# Patient Record
Sex: Male | Born: 1955 | Race: White | Hispanic: No | Marital: Married | Smoking: Never smoker
Health system: Southern US, Community
[De-identification: ages and names within clinical notes are randomized; demographics above are authoritative.]

## PROBLEM LIST (undated history)

## (undated) DIAGNOSIS — E785 Hyperlipidemia, unspecified: Secondary | ICD-10-CM

## (undated) DIAGNOSIS — I219 Acute myocardial infarction, unspecified: Secondary | ICD-10-CM

## (undated) HISTORY — PX: KNEE SURGERY: SHX244

---

## 2010-12-17 ENCOUNTER — Encounter (INDEPENDENT_AMBULATORY_CARE_PROVIDER_SITE_OTHER): Payer: Self-pay | Admitting: *Deleted

## 2010-12-17 ENCOUNTER — Ambulatory Visit
Admission: RE | Admit: 2010-12-17 | Discharge: 2010-12-17 | Payer: Self-pay | Source: Home / Self Care | Admitting: Emergency Medicine

## 2010-12-17 DIAGNOSIS — E785 Hyperlipidemia, unspecified: Secondary | ICD-10-CM | POA: Insufficient documentation

## 2011-01-22 NOTE — Assessment & Plan Note (Signed)
Summary: FEVER,CHILLS,SOB,SINUS PROB./WSE   Vital Signs:  Patient Profile:   55 Years Old Male CC:      body aches, HA, sinus pressure x yesterday Height:     74 inches Weight:      236 pounds O2 Sat:      97 % O2 treatment:    Room Air Temp:     98.9 degrees F oral Pulse rate:   97 / minute Resp:     14 per minute BP sitting:   151 / 106  (left arm) Cuff size:   large  Vitals Entered By: Lajean Saver RN (December 17, 2010 4:29 PM)                  Updated Prior Medication List: * UNKNOWN CHOLESTEROL MED   Current Allergies: No known allergies History of Present Illness Chief Complaint: body aches, HA, sinus pressure x yesterday History of Present Illness: 55 Years Old Male complains of onset of cold symptoms for 3 days.  Cesar Garza has been using no OTC meds. + sore throat + cough No pleuritic pain No wheezing + nasal congestion + post-nasal drainage + sinus pain/pressure No chest congestion No itchy/red eyes No earache No hemoptysis No SOB No chills/sweats No fever No nausea No vomiting No abdominal pain No diarrhea No skin rashes + fatigue + myalgias + headache   REVIEW OF SYSTEMS Constitutional Symptoms       Complains of night sweats.     Denies fever, chills, weight loss, weight gain, and fatigue.  Eyes       Denies change in vision, eye pain, eye discharge, glasses, contact lenses, and eye surgery. Ear/Nose/Throat/Mouth       Complains of sinus problems.      Denies hearing loss/aids, change in hearing, ear pain, ear discharge, dizziness, frequent runny nose, frequent nose bleeds, sore throat, hoarseness, and tooth pain or bleeding.      Comments: sinus pressure Respiratory       Denies dry cough, productive cough, wheezing, shortness of breath, asthma, bronchitis, and emphysema/COPD.  Cardiovascular       Denies murmurs, chest pain, and tires easily with exhertion.    Gastrointestinal       Denies stomach pain, nausea/vomiting, diarrhea,  constipation, blood in bowel movements, and indigestion. Genitourniary       Denies painful urination, kidney stones, and loss of urinary control. Neurological       Complains of headaches.      Denies paralysis, seizures, and fainting/blackouts. Musculoskeletal       Complains of muscle pain, joint pain, and joint stiffness.      Denies decreased range of motion, redness, swelling, muscle weakness, and gout.      Comments: body aches Skin       Denies bruising, unusual mles/lumps or sores, and hair/skin or nail changes.  Psych       Denies mood changes, temper/anger issues, anxiety/stress, speech problems, depression, and sleep problems.  Past History:  Past Medical History: Hyperlipidemia  Past Surgical History: ACL bilateral knees  Family History: none  Social History: Occupation: Higher education careers adviser Married Never Smoked Alcohol use-no Drug use-no Smoking Status:  never Drug Use:  no Physical Exam General appearance: well developed, well nourished, no acute distress Head: mild maxillary and frontal sinus tenderness Ears: normal, no lesions or deformities Nasal: clear discharge Oral/Pharynx: tongue normal, posterior pharynx without erythema or exudate Chest/Lungs: no rales, wheezes, or rhonchi bilateral, breath sounds equal without effort Heart:  regular rate and  rhythm, no murmur Skin: no obvious rashes or lesions MSE: oriented to time, place, and person Assessment New Problems: UPPER RESPIRATORY INFECTION, ACUTE (ICD-465.9) HYPERLIPIDEMIA (ICD-272.4)   Patient Education: Patient and/or caregiver instructed in the following: rest, fluids.  Plan New Medications/Changes: AMOXICILLIN 875 MG TABS (AMOXICILLIN) 1 by mouth two times a day for 7 days  #14 x 0, 12/17/2010, Hoyt Koch MD PREDNISONE (PAK) 10 MG TABS (PREDNISONE) 6 day pack, use as directed  #1 x 0, 12/17/2010, Hoyt Koch MD  Planning Comments:   1)  Take the prescribed antibiotic as  instructed. (hold for a few days and only take if worsening since this is currently likely viral) 2)  Use nasal saline solution (over the counter) at least 3 times a day. 3)  Use over the counter decongestants like Zyrtec-D every 12 hours as needed to help with congestion. 4)  Can take tylenol every 6 hours or motrin every 8 hours for pain or fever. 5)  Follow up with your primary doctor  if no improvement in 5-7 days, sooner if increasing pain, fever, or new symptoms.    The patient and/or caregiver has been counseled thoroughly with regard to medications prescribed including dosage, schedule, interactions, rationale for use, and possible side effects and they verbalize understanding.  Diagnoses and expected course of recovery discussed and will return if not improved as expected or if the condition worsens. Patient and/or caregiver verbalized understanding.  Prescriptions: AMOXICILLIN 875 MG TABS (AMOXICILLIN) 1 by mouth two times a day for 7 days  #14 x 0   Entered and Authorized by:   Hoyt Koch MD   Signed by:   Hoyt Koch MD on 12/17/2010   Method used:   Print then Give to Patient   RxID:   [id number] PREDNISONE (PAK) 10 MG TABS (PREDNISONE) 6 day pack, use as directed  #1 x 0   Entered and Authorized by:   Hoyt Koch MD   Signed by:   Hoyt Koch MD on 12/17/2010   Method used:   Print then Give to Patient   RxID:   [id number]

## 2011-01-22 NOTE — Letter (Signed)
Summary: Out of Work  [facility]  [address]   [place], [place] [address]   Phone: [phone]  Fax: [phone]    December 17, 2010   Employee:  CHALMER ZHENG    To Whom It May Concern:   For Medical reasons, please excuse the above named employee from work for the following dates:  Start:   December 28th, 2011  End:   December 29th, 2011 Return December 30th, 2011  If you need additional information, please feel free to contact our office.         Sincerely,    Lajean Saver RN

## 2011-04-24 ENCOUNTER — Ambulatory Visit (INDEPENDENT_AMBULATORY_CARE_PROVIDER_SITE_OTHER): Payer: BC Managed Care – PPO | Admitting: Family Medicine

## 2011-04-24 ENCOUNTER — Encounter: Payer: Self-pay | Admitting: Family Medicine

## 2011-04-24 VITALS — BP 137/96 | HR 78 | Temp 98.3°F | Ht 74.0 in | Wt 232.0 lb

## 2011-04-24 DIAGNOSIS — J209 Acute bronchitis, unspecified: Secondary | ICD-10-CM

## 2011-04-24 MED ORDER — HYDROCODONE-HOMATROPINE 5-1.5 MG/5ML PO SYRP: ORAL_SOLUTION | ORAL | Status: DC

## 2011-04-24 MED ORDER — AZITHROMYCIN 250 MG PO TABS: ORAL_TABLET | ORAL | Status: AC

## 2011-04-24 NOTE — Progress Notes (Signed)
  Subjective:    Patient ID: Cesar Garza, male    DOB: Feb 11, 1956, 55 y.o.   MRN: [id number]  HPI54 yo WM presents for chest congestion that started 2 wks ago.  Has a sore throat.  No rhinorrhea.  Producing phlegm morning and night.  He is taking Mucinex which helps a little bit.  The cough is keeping him up at night.  No hemoptysis.  Quit smoking 20 yrs ago.  Feels SOB.  No fevers or chills.  He usually goes to the [place] for his care.  BP 137/96  Pulse 78  Temp(Src) 98.3 F (36.8 C) (Oral)  Ht 6\' 2"  (1.88 m)  Wt 232 lb (105.235 kg)  BMI 29.79 kg/m2  SpO2 96%      Review of Systems  Constitutional: Negative for fever, chills, appetite change and fatigue.  HENT: Positive for congestion, sore throat and postnasal drip. Negative for rhinorrhea.   Eyes: Negative for itching.  Respiratory: Positive for cough. Negative for chest tightness and wheezing.   Cardiovascular: Negative for chest pain, palpitations and leg swelling.  Gastrointestinal: Negative for nausea, vomiting, abdominal pain and diarrhea.  Psychiatric/Behavioral: Positive for sleep disturbance. Negative for dysphoric mood. The patient is not nervous/anxious.        Objective:   Physical Exam  Constitutional: He appears well-developed and well-nourished.  HENT:  Head: Normocephalic.  Right Ear: External ear normal.  Left Ear: External ear normal.  Mouth/Throat: Oropharynx is clear and moist.       Nasal congestion, sinuses NTTP  Eyes: Conjunctivae are normal.  Cardiovascular: Normal rate and normal heart sounds.   No murmur heard. Pulmonary/Chest: Effort normal and breath sounds normal. No respiratory distress.       Dry hacking rhonchorous cough  Musculoskeletal: He exhibits no edema.  Lymphadenopathy:    He has cervical adenopathy.  Skin: Skin is warm and dry. No rash noted.  Psychiatric: He has a normal mood and affect.          Assessment & Plan:

## 2011-04-24 NOTE — Patient Instructions (Signed)
Take Zithromax x 5 days for bronchitis.  Use Delsym during the day for cough (OTC) and Hycodan at night. Rest, clear fluids and call if not improved in 1 wk.  I suggest coming back for a nurse BP check in the next month.

## 2011-04-24 NOTE — Assessment & Plan Note (Signed)
Given prolonged 2 + wk of hacking cough with rhonchi, will treat with Zithromax x 5 days.  He can take delsym during the day for cough and hycodan at night.  Call if not improved in 1 wk.

## 2011-12-02 ENCOUNTER — Emergency Department (INDEPENDENT_AMBULATORY_CARE_PROVIDER_SITE_OTHER)
Admission: EM | Admit: 2011-12-02 | Discharge: 2011-12-02 | Disposition: A | Discharge status: Home / Self Care | Payer: BC Managed Care – PPO | Source: Home / Self Care | Attending: Family Medicine | Admitting: Family Medicine

## 2011-12-02 ENCOUNTER — Encounter: Payer: Self-pay | Admitting: *Deleted

## 2011-12-02 DIAGNOSIS — R6889 Other general symptoms and signs: Secondary | ICD-10-CM

## 2011-12-02 HISTORY — DX: Hyperlipidemia, unspecified: E78.5

## 2011-12-02 MED ORDER — BENZONATATE 200 MG PO CAPS: 200.0000 mg | ORAL_CAPSULE | Freq: Every day | ORAL | Status: AC

## 2011-12-02 MED ORDER — OSELTAMIVIR PHOSPHATE 75 MG PO CAPS: 75.0000 mg | ORAL_CAPSULE | Freq: Two times a day (BID) | ORAL | Status: AC

## 2011-12-02 NOTE — ED Provider Notes (Signed)
History     CSN: [id number] Arrival date & time: 12/02/2011  8:14 AM   First MD Initiated Contact with Patient 12/02/11 [phone]      Chief Complaint  Patient presents with  . Generalized Body Aches      HPI Comments: HPI : Flu symptoms started last night. Fever with chills, sweats, myalgias, fatigue, headache. Symptoms are progressively worsening, despite trying OTC fever reducing medicine and rest and fluids. Has decreased appetite, but tolerating some liquids by mouth.   Review of Systems: Positive for fatigue, mild nasal congestion, mild sore throat, mild swollen anterior neck glands, mild cough. Negative for acute vision changes, stiff neck, focal weakness, syncope, seizures, respiratory distress, vomiting, diarrhea, GU symptoms.    Past Medical History  Diagnosis Date  . Hyperlipidemia     Past Surgical History  Procedure Date  . Knee surgery     both knees x 5     Family History  Problem Relation Age of Onset  . Diabetes Mother     History  Substance Use Topics  . Smoking status: Never Smoker   . Smokeless tobacco: Not on file  . Alcohol Use: No      Review of Systems  Allergies  Review of patient's allergies indicates no known allergies.  Home Medications   Current Outpatient Rx  Name Route Sig Dispense Refill  . BENZONATATE 200 MG PO CAPS Oral Take 1 capsule (200 mg total) by mouth at bedtime. Take as needed for cough 12 capsule 0  . HYDROCODONE-HOMATROPINE 5-1.5 MG/5ML PO SYRP  5 ml po qhs prn cough 120 mL 0  . OSELTAMIVIR PHOSPHATE 75 MG PO CAPS Oral Take 1 capsule (75 mg total) by mouth every 12 (twelve) hours. 10 capsule 0    BP 130/80  Pulse 106  Temp(Src) 98.5 F (36.9 C) (Oral)  Resp 16  Ht 6\' 2"  (1.88 m)  Wt 238 lb (107.956 kg)  BMI 30.56 kg/m2  SpO2 95%  Physical Exam Nursing notes and Vital Signs reviewed. Appearance:  Patient appears healthy, stated age, and in no acute distress Eyes:  Pupils are equal, round, and reactive to  light and accomodation.  Extraocular movement is intact.  Conjunctivae are not inflamed  Ears:  Canals normal.  Tympanic membranes normal.  Nose:  Mildly congested turbinates.  No sinus tenderness.    Pharynx:  Erythematous and slightly swollen without obstruction.  Neck:  Supple.  Slightly tender shotty posterior nodes are palpated bilaterally  Lungs:  Clear to auscultation.  Breath sounds are equal.  Heart:  Regular rate and rhythm without murmurs, rubs, or gallops.  Abdomen:  Nontender without masses or hepatosplenomegaly.  Bowel sounds are present.  No CVA or flank tenderness.  Extremities:  No edema.  No calf tenderness Skin:  No rash present.   ED Course  Procedures       1. Influenza-like illness        MDM There is no evidence of bacterial infection today.   Begin Tamiflu.  Cough suppressant at bedtime. Take Mucinex, or Mucinex D (guaifenesin with decongestant) twice daily for congestion.  Increase fluid intake, rest. May use Afrin nasal spray (or generic oxymetazoline) twice daily for about 5 days.  Also recommend using saline nasal spray several times daily and/or saline nasal irrigation. Stop all antihistamines for now, and other non-prescription cough/cold preparations. May take Ibuprofen 200mg , 4 tabs every 8 hours with food for headache, body aches, etc. Recommend flu shot when well. Follow-up with family  doctor if not improving 5 days.        Donna Christen, MD 12/02/11 [phone]

## 2011-12-02 NOTE — ED Notes (Signed)
Patient c/o body aches, chills/sweats, HA and nausea x last night. He did not receive a flu shot. Taken Advil @ 4am.

## 2011-12-04 ENCOUNTER — Telehealth: Payer: Self-pay | Admitting: Emergency Medicine

## 2011-12-19 ENCOUNTER — Emergency Department
Admission: EM | Admit: 2011-12-19 | Discharge: 2011-12-19 | Disposition: A | Discharge status: Home / Self Care | Payer: BC Managed Care – PPO | Source: Home / Self Care

## 2014-08-22 ENCOUNTER — Emergency Department (HOSPITAL_COMMUNITY)
Admission: EM | Admit: 2014-08-22 | Discharge: 2014-08-22 | Discharge status: Against Medical Advice | Payer: Federal, State, Local not specified - PPO | Attending: Emergency Medicine | Admitting: Emergency Medicine

## 2014-08-22 ENCOUNTER — Encounter (HOSPITAL_COMMUNITY): Payer: Self-pay | Admitting: Emergency Medicine

## 2014-08-22 DIAGNOSIS — N200 Calculus of kidney: Secondary | ICD-10-CM | POA: Diagnosis not present

## 2014-08-22 DIAGNOSIS — R109 Unspecified abdominal pain: Secondary | ICD-10-CM | POA: Diagnosis present

## 2014-08-22 MED ORDER — SODIUM CHLORIDE 0.9 % IV BOLUS (SEPSIS): 1000.0000 mL | Freq: Once | INTRAVENOUS | Status: DC

## 2014-08-22 NOTE — ED Provider Notes (Signed)
Patient left prior to evaluation   Richardean Canal, MD 08/22/14 [phone]

## 2014-08-22 NOTE — ED Notes (Signed)
Patient in from work via EMS. Patient states he was urinating in the bathroom and began to have sharpe pain left abdomin and flank. Patient does have hx of kidney stone. Patient was given of Fentanyl with EMS. Patient was having some nausea but is resolved.

## 2017-10-22 ENCOUNTER — Other Ambulatory Visit: Payer: Self-pay | Admitting: Internal Medicine

## 2017-10-22 DIAGNOSIS — M25562 Pain in left knee: Secondary | ICD-10-CM

## 2017-10-22 DIAGNOSIS — M25561 Pain in right knee: Secondary | ICD-10-CM

## 2017-11-03 ENCOUNTER — Ambulatory Visit
Admission: RE | Admit: 2017-11-03 | Discharge: 2017-11-03 | Disposition: A | Discharge status: Home / Self Care | Payer: Self-pay | Source: Ambulatory Visit | Attending: Internal Medicine | Admitting: Internal Medicine

## 2017-11-03 ENCOUNTER — Other Ambulatory Visit: Payer: Federal, State, Local not specified - PPO

## 2017-11-03 DIAGNOSIS — M25562 Pain in left knee: Secondary | ICD-10-CM

## 2017-11-03 DIAGNOSIS — M25561 Pain in right knee: Secondary | ICD-10-CM

## 2019-01-06 ENCOUNTER — Other Ambulatory Visit: Payer: Self-pay

## 2019-01-06 ENCOUNTER — Emergency Department
Admission: EM | Admit: 2019-01-06 | Discharge: 2019-01-06 | Disposition: A | Discharge status: Home / Self Care | Payer: Federal, State, Local not specified - PPO | Source: Home / Self Care

## 2019-01-06 DIAGNOSIS — I440 Atrioventricular block, first degree: Secondary | ICD-10-CM

## 2019-01-06 DIAGNOSIS — R079 Chest pain, unspecified: Secondary | ICD-10-CM | POA: Diagnosis not present

## 2019-01-06 DIAGNOSIS — R9431 Abnormal electrocardiogram [ECG] [EKG]: Secondary | ICD-10-CM

## 2019-01-06 MED ORDER — NITROGLYCERIN 0.4 MG SL SUBL
0.4000 mg | SUBLINGUAL_TABLET | SUBLINGUAL | Status: DC | PRN
  Administered 2019-01-06: 0.4 mg via SUBLINGUAL

## 2019-01-06 MED ORDER — ASPIRIN 81 MG PO CHEW
324.0000 mg | CHEWABLE_TABLET | Freq: Once | ORAL | Status: AC
  Administered 2019-01-06: 324 mg via ORAL

## 2019-01-06 MED ORDER — ONDANSETRON 4 MG PO TBDP
4.0000 mg | ORAL_TABLET | Freq: Once | ORAL | Status: AC
  Administered 2019-01-06: 4 mg via ORAL

## 2019-01-06 NOTE — ED Triage Notes (Signed)
Pt stated that he has had heartburn since this morning.  Has taken mylanta, and a GI cocktail.  Feels like there is a knot in his chest.

## 2019-01-06 NOTE — ED Provider Notes (Signed)
[facility]    CSN: [id number] Arrival date & time: 01/06/19  1843     History   Chief Complaint No chief complaint on file.   HPI Cesar Garza is a 63 y.o. male.   HPI  Patient presents today with one day of chest pain with nausea. This is a new problem and has been occurring all day. He initially thought symptoms were related to indigestion and had taken Mylanta without symptoms improvement. Blood pressure elevated on arrival 165/89. He has positive risk factors for ACS including untreated hyperlipidemia, obesity, positive family history of heart disease mother and brother recently passed away of heart attack. No prior history of cardiovascular disease.  EKG significant for 1st degree block. He has not taken any aspirin prior to arrival.  Past Medical History:  Diagnosis Date  . Hyperlipidemia     Patient Active Problem List   Diagnosis Date Noted  . Bronchitis, acute 04/24/2011  . HYPERLIPIDEMIA 12/17/2010    Past Surgical History:  Procedure Laterality Date  . KNEE SURGERY     both knees x 5        Home Medications    Prior to Admission medications   Medication Sig Start Date End Date Taking? Authorizing Provider  ibuprofen (ADVIL,MOTRIN) 800 MG tablet Take 800 mg by mouth daily.    [provider]  Multiple Vitamin (MULTIVITAMIN WITH MINERALS) TABS tablet Take 1 tablet by mouth daily.    [provider]    Family History Family History  Problem Relation Age of Onset  . Diabetes Mother     Social History Social History   Tobacco Use  . Smoking status: Never Smoker  Substance Use Topics  . Alcohol use: No  . Drug use: No     Allergies   Patient has no known allergies.   Review of Systems Review of Systems  Pertinent negatives listed in HPI Physical Exam Triage Vital Signs ED Triage Vitals  Enc Vitals Group     BP      Pulse      Resp      Temp      Temp src      SpO2      Weight      Height      Head  Circumference      Peak Flow      Pain Score      Pain Loc      Pain Edu?      Excl. in GC?    No data found.  Updated Vital Signs BP (!) 146/82   Pulse 70   Resp 18   Ht 6\' 2"  (1.88 m)   Wt 225 lb (102.1 kg)   SpO2 98%   BMI 28.89 kg/m   Visual Acuity Right Eye Distance:   Left Eye Distance:   Bilateral Distance:    Right Eye Near:   Left Eye Near:    Bilateral Near:     Physical Exam Physical Exam: Constitutional: Patient appears well-developed and well-nourished. No distress. HENT: Normocephalic, atraumatic,  Eyes: Conjunctivae and EOM are normal. PERRLA, no scleral icterus. Neck: Normal ROM. Neck supple. No JVD. No tracheal deviation. No thyromegaly. CVS: RRR, S1/S2 +, no murmurs, no gallops, no carotid bruit.  Pulmonary: Effort and breath sounds normal, no stridor, rhonchi, wheezes, rales.  Abdominal: Soft. BS +, no distension, tenderness, rebound or guarding.  Musculoskeletal: Normal range of motion. No edema and no tenderness.  Neuro: Alert, oriented x  3. Skin: Skin is warm , bilateral hand color is dusky, grayish. Psychiatric: Normal mood and affect. Behavior, judgment, thought content normal. UC Treatments / Results  Labs (all labs ordered are listed, but only abnormal results are displayed) Labs Reviewed - No data to display  EKG None  Radiology No results found.  Procedures Procedures (including critical care time)  Medications Ordered in UC Medications - No data to display  Initial Impression / Assessment and Plan / UC Course  I have reviewed the triage vital signs and the nursing notes.  Pertinent labs & imaging results that were available during my care of the patient were reviewed by me and considered in my medical decision making (see chart for details).   Patient transported via EMS to [facility] for work-up for possible ACS.  Reports given to EMS, copy of EKG, and demographics provided. Spouse accompanied patient with  EMS.   Final Clinical Impressions(s) / UC Diagnoses   Final diagnoses:  Chest pain, unspecified type  Abnormal EKG  Heart block AV first degree   Discharge Instructions   None    ED Prescriptions    None     Controlled Substance Prescriptions [place] Controlled Substance Registry consulted? Not Applicable   Bing Neighbors, FNP 01/08/19 1123

## 2019-01-07 ENCOUNTER — Telehealth: Payer: Self-pay | Admitting: Emergency Medicine

## 2019-01-07 MED ORDER — METOCLOPRAMIDE HCL 5 MG/ML IJ SOLN
10.00 | INTRAMUSCULAR | Status: DC
Start: ? — End: 2019-01-07

## 2019-01-07 MED ORDER — ALUM & MAG HYDROXIDE-SIMETH 200-200-20 MG/5ML PO SUSP
30.00 | ORAL | Status: DC
Start: ? — End: 2019-01-07

## 2019-01-07 MED ORDER — GENERIC EXTERNAL MEDICATION
10.00 | Status: DC
Start: ? — End: 2019-01-07

## 2019-01-07 MED ORDER — ASPIRIN EC 81 MG PO TBEC
81.00 | DELAYED_RELEASE_TABLET | ORAL | Status: DC
Start: 2019-01-08 — End: 2019-01-07

## 2019-01-07 MED ORDER — NITROGLYCERIN IN D5W 200-5 MCG/ML-% IV SOLN
10.00 | INTRAVENOUS | Status: DC
Start: ? — End: 2019-01-07

## 2019-01-07 MED ORDER — MUPIROCIN 2 % EX OINT
TOPICAL_OINTMENT | CUTANEOUS | Status: DC
Start: 2019-01-10 — End: 2019-01-07

## 2019-01-07 MED ORDER — GENERIC EXTERNAL MEDICATION
650.00 | Status: DC
Start: ? — End: 2019-01-07

## 2019-01-07 MED ORDER — GENERIC EXTERNAL MEDICATION
5.00 | Status: DC
Start: ? — End: 2019-01-07

## 2019-01-07 MED ORDER — GENERIC EXTERNAL MEDICATION
30.00 | Status: DC
Start: ? — End: 2019-01-07

## 2019-01-07 MED ORDER — SENNA-DOCUSATE SODIUM 8.6-50 MG PO TABS
1.00 | ORAL_TABLET | ORAL | Status: DC
Start: ? — End: 2019-01-07

## 2019-01-07 MED ORDER — METOPROLOL TARTRATE 25 MG PO TABS
25.00 | ORAL_TABLET | ORAL | Status: DC
Start: 2019-01-10 — End: 2019-01-07

## 2019-01-07 MED ORDER — PANTOPRAZOLE SODIUM 40 MG PO TBEC
40.00 | DELAYED_RELEASE_TABLET | ORAL | Status: DC
Start: 2019-01-11 — End: 2019-01-07

## 2019-01-07 MED ORDER — ALPRAZOLAM 0.25 MG PO TABS
0.25 | ORAL_TABLET | ORAL | Status: DC
Start: ? — End: 2019-01-07

## 2019-01-07 MED ORDER — MORPHINE SULFATE (PF) 2 MG/ML IV SOLN
2.00 | INTRAVENOUS | Status: DC
Start: ? — End: 2019-01-07

## 2019-01-07 MED ORDER — SUCRALFATE 1 GM/10ML PO SUSP
1.00 | ORAL | Status: DC
Start: 2019-01-07 — End: 2019-01-07

## 2019-01-07 MED ORDER — MAGNESIUM HYDROXIDE 400 MG/5ML PO SUSP
30.00 | ORAL | Status: DC
Start: ? — End: 2019-01-07

## 2019-01-07 MED ORDER — ATORVASTATIN CALCIUM 80 MG PO TABS
80.00 | ORAL_TABLET | ORAL | Status: DC
Start: 2019-01-07 — End: 2019-01-07

## 2019-01-07 MED ORDER — GENERIC EXTERNAL MEDICATION
1.00 | Status: DC
Start: ? — End: 2019-01-07

## 2019-01-07 MED ORDER — NITROGLYCERIN 0.4 MG SL SUBL
0.40 | SUBLINGUAL_TABLET | SUBLINGUAL | Status: DC
Start: ? — End: 2019-01-07

## 2019-01-07 MED ORDER — DIPHENHYDRAMINE HCL 25 MG PO CAPS
25.00 | ORAL_CAPSULE | ORAL | Status: DC
Start: ? — End: 2019-01-07

## 2019-01-07 MED ORDER — SODIUM CHLORIDE 0.9 % IV SOLN
10.00 | INTRAVENOUS | Status: DC
Start: ? — End: 2019-01-07

## 2019-01-07 NOTE — Telephone Encounter (Signed)
Spoke with patient's wife; patient did get diagnosed with MI at [facility] ER and was then transferred to [facility]. He is stable and they are appreciative.

## 2019-01-12 MED ORDER — ASPIRIN EC 81 MG PO TBEC
81.00 | DELAYED_RELEASE_TABLET | ORAL | Status: DC
Start: 2019-01-11 — End: 2019-01-12

## 2019-01-12 MED ORDER — LEVOFLOXACIN 750 MG PO TABS
750.00 | ORAL_TABLET | ORAL | Status: DC
Start: 2019-01-11 — End: 2019-01-12

## 2019-01-12 MED ORDER — ROSUVASTATIN CALCIUM 40 MG PO TABS
40.00 | ORAL_TABLET | ORAL | Status: DC
Start: 2019-01-10 — End: 2019-01-12

## 2019-01-12 MED ORDER — METOCLOPRAMIDE HCL 5 MG/ML IJ SOLN
10.00 | INTRAMUSCULAR | Status: DC
Start: ? — End: 2019-01-12

## 2019-01-12 MED ORDER — ALPRAZOLAM 0.25 MG PO TABS
0.25 | ORAL_TABLET | ORAL | Status: DC
Start: ? — End: 2019-01-12

## 2019-01-12 MED ORDER — SENNA-DOCUSATE SODIUM 8.6-50 MG PO TABS
1.00 | ORAL_TABLET | ORAL | Status: DC
Start: ? — End: 2019-01-12

## 2019-01-12 MED ORDER — MORPHINE SULFATE (PF) 4 MG/ML IV SOLN
2.00 | INTRAVENOUS | Status: DC
Start: ? — End: 2019-01-12

## 2019-01-12 MED ORDER — NITROGLYCERIN 0.4 MG SL SUBL
0.40 | SUBLINGUAL_TABLET | SUBLINGUAL | Status: DC
Start: ? — End: 2019-01-12

## 2019-01-12 MED ORDER — BENZOCAINE-MENTHOL 15-3.6 MG MT LOZG
1.00 | LOZENGE | OROMUCOSAL | Status: DC
Start: ? — End: 2019-01-12

## 2019-01-12 MED ORDER — TICAGRELOR 90 MG PO TABS
90.00 | ORAL_TABLET | ORAL | Status: DC
Start: 2019-01-10 — End: 2019-01-12

## 2019-01-28 IMAGING — MR MR KNEE*R* W/O CM
3 series · 18 of 40 positions shown · non-contrast
Comparison: None.

CLINICAL DATA: Right knee pain.  Akjagul research study.

EXAM:
MRI OF THE RIGHT KNEE WITHOUT CONTRAST
TECHNIQUE: Multiplanar, multisequence MR imaging of the knee was performed. No
intravenous contrast was administered.

[Series 3: T1 · coronal · 3.0mm · 0.33mm/px · 3 of 30 slices shown]
[im 5/30]
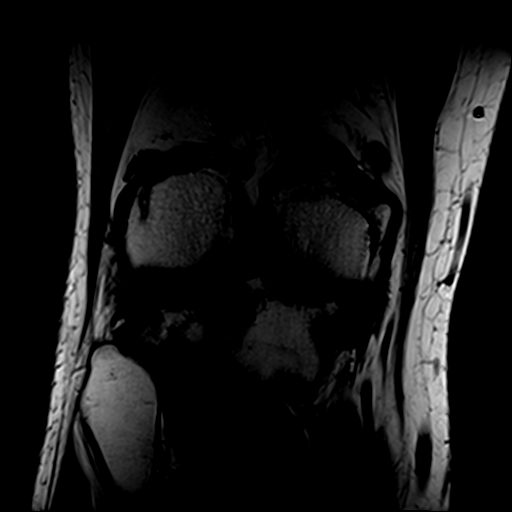
[im 16/30]
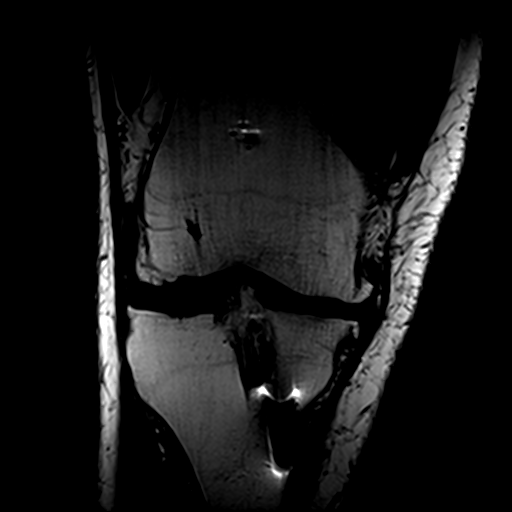
[im 25/30]
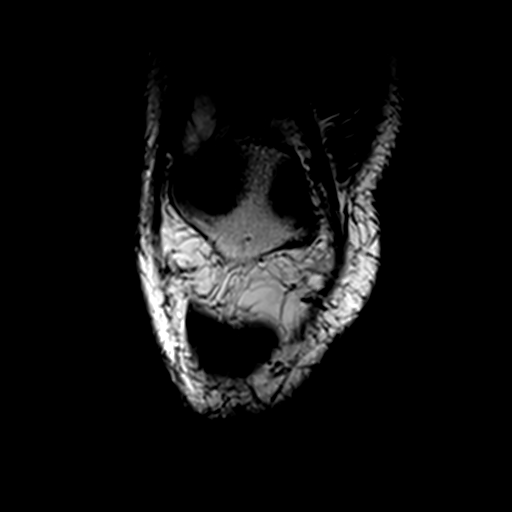

[Series 4: PD fat-sat · coronal · 3.0mm · 0.33mm/px · 9 of 30 slices shown (1 of 2)]
[im 1/30]
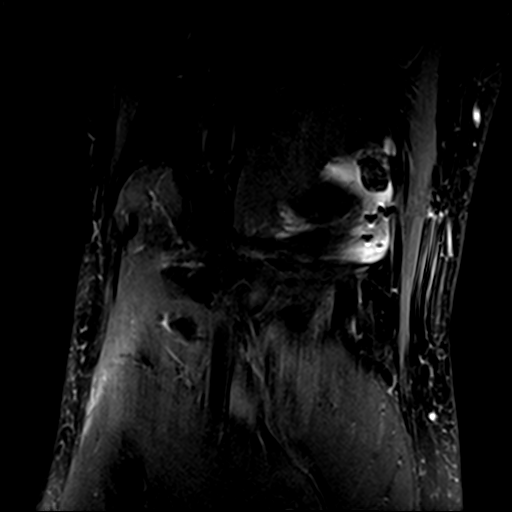
[im 5/30]
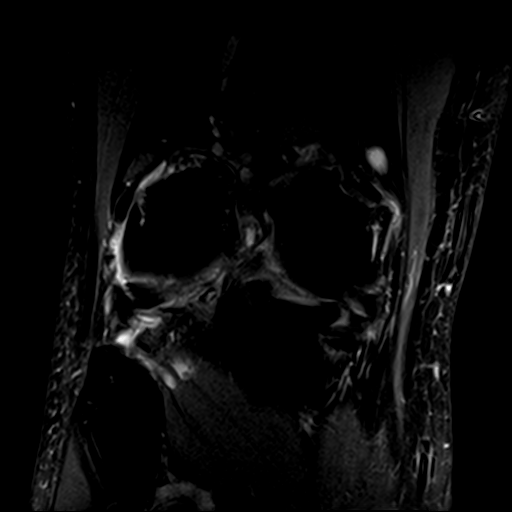
[im 10/30]
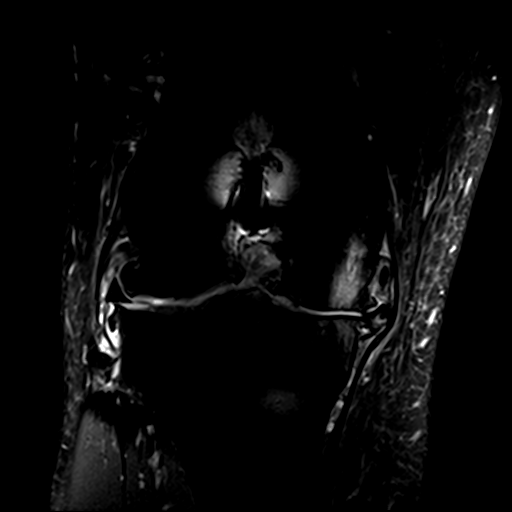
[im 13/30]
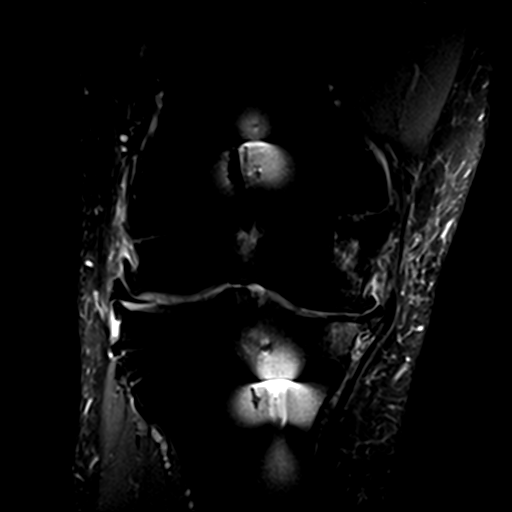
[im 15/30]
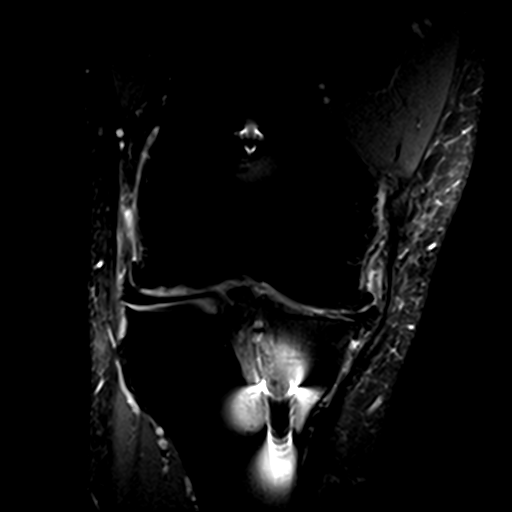
[im 17/30]
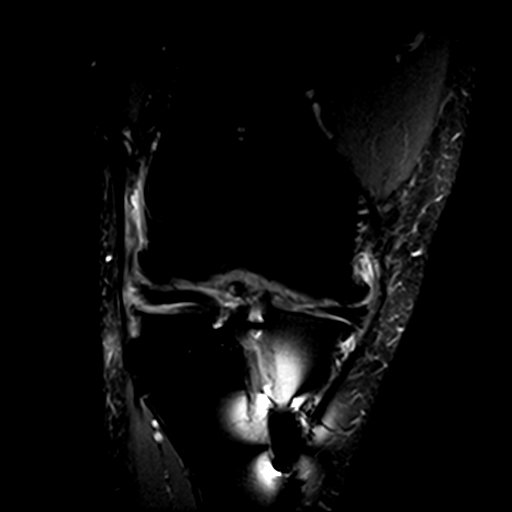
[im 20/30]
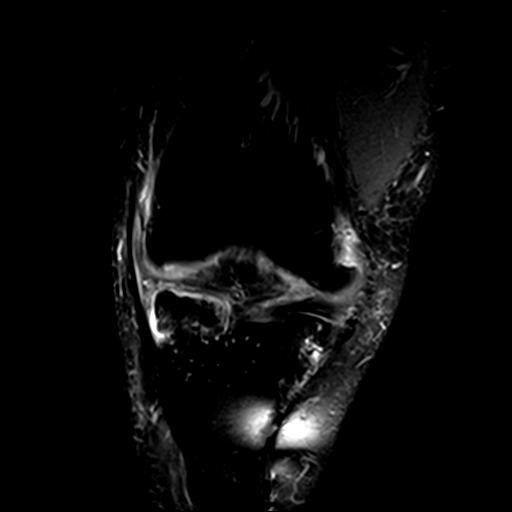
[im 25/30]
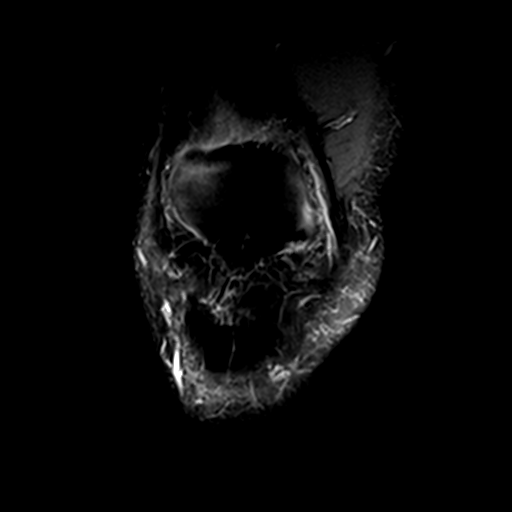
[im 30/30]
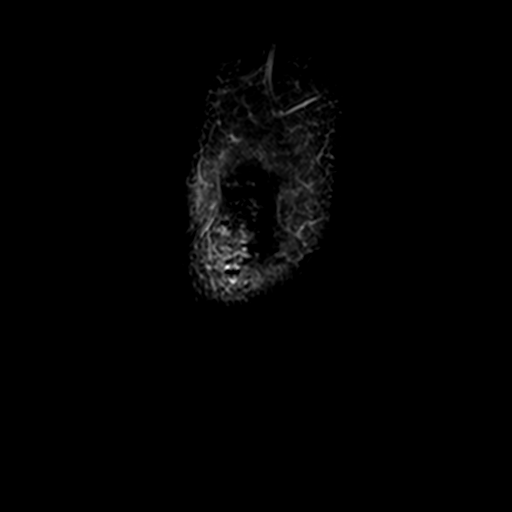

[Series 5: PD fat-sat · sagittal · 3.0mm · 0.33mm/px · 6 of 30 slices shown (2 of 2)]
[im 1/30]
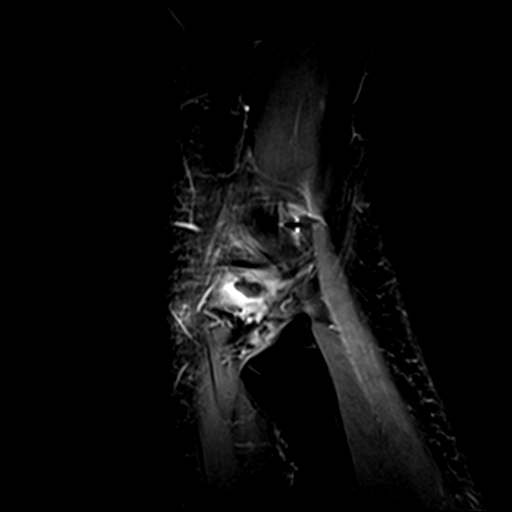
[im 5/30]
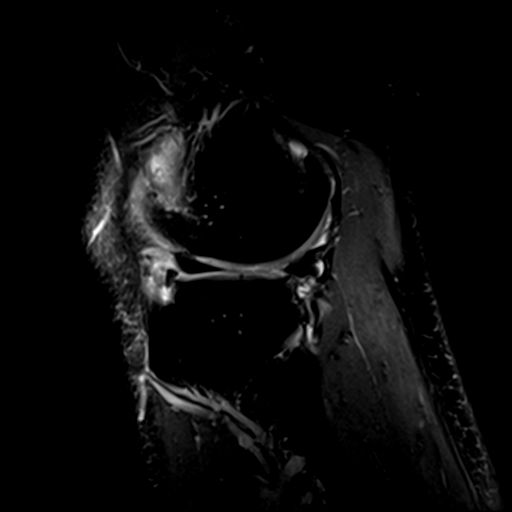
[im 10/30]
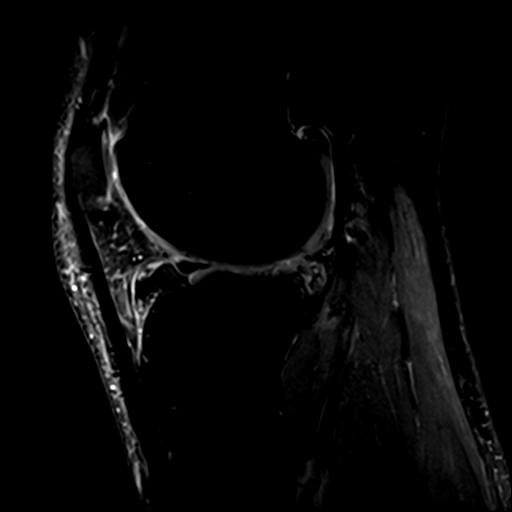
[im 13/30]
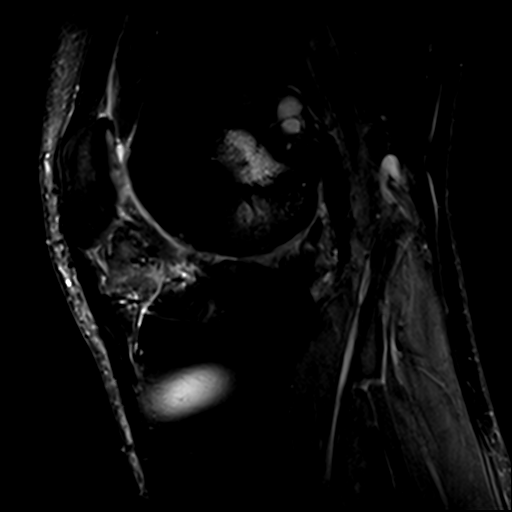
[im 15/30]
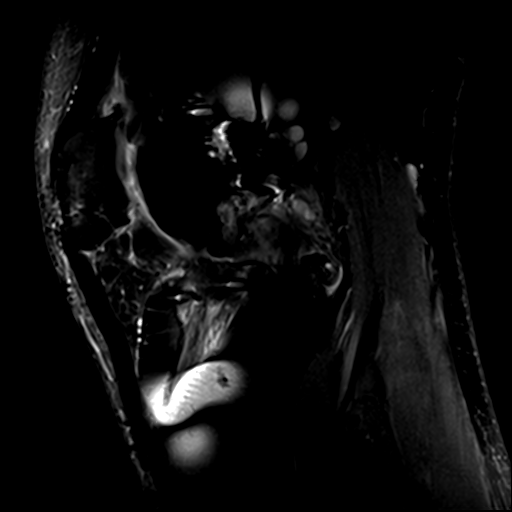
[im 25/30]
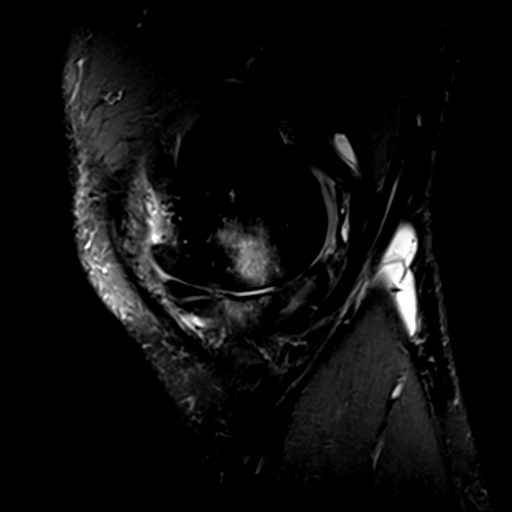

[18 of 40 positions shown; findings below may reference images not displayed]

FINDINGS: MENISCI

Medial meniscus: Complete avulsion of the posterior root with
extrusion and horizontal tearing of the body.

Lateral meniscus: Complex maceration of the posterior horn and root.
Oblique undersurface tear of the body.

LIGAMENTS

Cruciates: Prior anterior cruciate ligament reconstruction with
nonvisualization of the graft. Partial tearing of the mid posterior
cruciate ligament.

Collaterals: The medial collateral ligament and lateral collateral
ligament complex are grossly intact.

CARTILAGE

Patellofemoral: Full-thickness fissuring and cartilage loss over the
inferior patella. Full-thickness cartilage loss over the medial and
lateral trochlea.

Medial: Large areas of full-thickness cartilage loss over the
central weight-bearing medial femoral condyle and medial tibial
plateau with underlying subchondral marrow edema.

Lateral: Focal full-thickness cartilage loss along the peripheral
the lateral tibial plateau and central lateral femoral condyle.

Joint: No joint effusion. Multiple loose bodies, including a 13 mm
loose body in the popliteus tendon sheath.

Popliteal Fossa: Moderate Baker cyst containing a 15 mm loose body.
Intact popliteus tendon.

Extensor Mechanism:  Intact quadriceps tendon and patellar tendon.

Bones: Large tricompartmental osteophytes. No fracture or
dislocation.

Other: None.
IMPRESSION: 1. Severe medial and patellofemoral compartment degenerative changes
with large areas of full-thickness cartilage loss as described
above. Moderate lateral compartment degenerative changes with more
focal areas of full-thickness cartilage loss. Multiple large loose
bodies.
2. Avulsion of the medial meniscus posterior root with horizontal
tearing of the body.
3. Complex maceration of the lateral meniscus posterior horn and
root.
4. Prior ACL reconstruction with nonvisualization of the graft,
likely due to chronic tearing.
5. Partial tearing of the mid PCL, likely chronic.

## 2019-06-08 ENCOUNTER — Other Ambulatory Visit: Payer: Self-pay

## 2019-06-08 ENCOUNTER — Emergency Department
Admission: EM | Admit: 2019-06-08 | Discharge: 2019-06-08 | Disposition: A | Discharge status: Home / Self Care | Payer: Federal, State, Local not specified - PPO | Source: Home / Self Care

## 2019-06-08 DIAGNOSIS — B029 Zoster without complications: Secondary | ICD-10-CM

## 2019-06-08 HISTORY — DX: Acute myocardial infarction, unspecified: I21.9

## 2019-06-08 MED ORDER — VALACYCLOVIR HCL 1 G PO TABS: 1000.0000 mg | ORAL_TABLET | Freq: Three times a day (TID) | ORAL | 0 refills | Status: AC

## 2019-06-08 NOTE — ED Triage Notes (Signed)
Pt has 3 spots on left abd and back.  Started Monday afternoon.

## 2019-06-08 NOTE — Discharge Instructions (Addendum)
°  Your exam is most consistent with shingles. Please take the Valtrex antiviral medication as prescribed to help reduce the duration and severity of your symptoms. You may gently clean rash with warm water and mild soap.  Pat dry.  You may try over the counter anti-itch cream such as hydrocortisone or calamine lotion to help with irritation.  If your develop pain, you may take acetaminophen (Tylenol) or ibuprofen (Motrin).    Please call to schedule a follow up visit with your family doctor next week if not improving. You may also want to discuss the shingles vaccine to see if they want to offer it to you sooner than 63yo.

## 2019-06-08 NOTE — ED Provider Notes (Signed)
[name]    CSN: [id number] Arrival date & time: 06/08/19  1649     History   Chief Complaint Chief Complaint  Patient presents with  . Rash    HPI Cesar Garza is a 63 y.o. male.   HPI  Cesar Garza is a 63 y.o. male presenting to UC with c/o gradually worsening itchy red rash along the Left side of his abdomen wrapping around to his back. He thought it may have been poison ivy but he has not been in the yard for over 1 week.  He tried OTC benadryl gel with minimal relief and states it burns at times when he applies it.  He states his wife is concerned he has shingles. He did have chicken pox as a child. He has not received the shingles vaccine. Denies other symptoms including no fever, chills, HA, cough, congestion, fatigue or body aches.  He does report being highly stressed at work.      Past Medical History:  Diagnosis Date  . Heart attack ([place])    1/20  . Hyperlipidemia     Patient Active Problem List   Diagnosis Date Noted  . Bronchitis, acute 04/24/2011  . HYPERLIPIDEMIA 12/17/2010    Past Surgical History:  Procedure Laterality Date  . KNEE SURGERY     both knees x 5        Home Medications    Prior to Admission medications   Medication Sig Start Date End Date Taking? Authorizing Provider  metoprolol tartrate (LOPRESSOR) 25 MG tablet Take by mouth. 04/14/19  Yes [provider]  rosuvastatin (CRESTOR) 40 MG tablet  01/10/19  Yes [provider]  ASPIRIN LOW DOSE 81 MG EC tablet TAKE ONE TABLET (81 MG DOSE) BY MOUTH DAILY. 02/02/19   [provider]  ibuprofen (ADVIL,MOTRIN) 800 MG tablet Take 800 mg by mouth daily.    [provider]  Multiple Vitamin (MULTIVITAMIN WITH MINERALS) TABS tablet Take 1 tablet by mouth daily.    [provider]  ticagrelor (BRILINTA) 60 MG TABS tablet 1 tablet    [provider]  valACYclovir (VALTREX) 1000 MG tablet Take 1 tablet (1,000 mg total) by mouth 3  (three) times daily. 06/08/19   Noe Gens, PA-C    Family History Family History  Problem Relation Age of Onset  . Diabetes Mother   . Heart failure Mother   . Heart failure Brother     Social History Social History   Tobacco Use  . Smoking status: Never Smoker  Substance Use Topics  . Alcohol use: No  . Drug use: No     Allergies   Patient has no known allergies.   Review of Systems Review of Systems  Constitutional: Negative for chills and fever.  HENT: Negative for congestion, ear pain, sore throat, trouble swallowing and voice change.   Respiratory: Negative for cough and shortness of breath.   Cardiovascular: Negative for chest pain and palpitations.  Gastrointestinal: Negative for abdominal pain, diarrhea, nausea and vomiting.  Musculoskeletal: Negative for arthralgias, back pain and myalgias.  Skin: Positive for rash.     Physical Exam Triage Vital Signs  No data found.  Updated Vital Signs BP 116/82   Pulse 75   Temp 98.3 F (36.8 C) (Oral)   Resp 20   Ht 6\' 2"  (1.88 m)   Wt 221 lb (100.2 kg)   SpO2 98%   BMI 28.37 kg/m   Visual Acuity Right Eye Distance:  Left Eye Distance:   Bilateral Distance:    Right Eye Near:   Left Eye Near:    Bilateral Near:     Physical Exam Vitals signs and nursing note reviewed.  Constitutional:      General: He is not in acute distress.    Appearance: Normal appearance. He is well-developed. He is not ill-appearing, toxic-appearing or diaphoretic.  HENT:     Head: Normocephalic and atraumatic.     Mouth/Throat:     Mouth: Mucous membranes are moist.  Neck:     Musculoskeletal: Normal range of motion.  Cardiovascular:     Rate and Rhythm: Normal rate and regular rhythm.  Pulmonary:     Effort: Pulmonary effort is normal.  Musculoskeletal: Normal range of motion.        General: No tenderness.  Skin:    General: Skin is warm and dry.     Capillary Refill: Capillary refill takes less than 2  seconds.     Findings: Rash present.          Comments: Erythematous macular rash with small vesicle oozing scant clear-yellow drainage on rash on abdomen. Non-tender.  Rash is along dermatomal distribution.   Neurological:     Mental Status: He is alert and oriented to person, place, and time.  Psychiatric:        Behavior: Behavior normal.      UC Treatments / Results  Labs (all labs ordered are listed, but only abnormal results are displayed) Labs Reviewed - No data to display  EKG None  Radiology No results found.  Procedures Procedures (including critical care time)  Medications Ordered in UC Medications - No data to display  Initial Impression / Assessment and Plan / UC Course  I have reviewed the triage vital signs and the nursing notes.  Pertinent labs & imaging results that were available during my care of the patient were reviewed by me and considered in my medical decision making (see chart for details).     Hx and exam c/w herpes zoster w/o complication. Pt denies hx of kidney or liver disease. Will start pt on Valtrex 1000mg  TID for 7 days Encouraged f/u with PCP next week as needed. AVS provided.   Final Clinical Impressions(s) / UC Diagnoses   Final diagnoses:  Herpes zoster without complication     Discharge Instructions      Your exam is most consistent with shingles. Please take the Valtrex antiviral medication as prescribed to help reduce the duration and severity of your symptoms. You may gently clean rash with warm water and mild soap.  Pat dry.  You may try over the counter anti-itch cream such as hydrocortisone or calamine lotion to help with irritation.  If your develop pain, you may take acetaminophen (Tylenol) or ibuprofen (Motrin).    Please call to schedule a follow up visit with your family doctor next week if not improving. You may also want to discuss the shingles vaccine to see if they want to offer it to you sooner than 63yo.      ED Prescriptions    Medication Sig Dispense Auth. Provider   valACYclovir (VALTREX) 1000 MG tablet Take 1 tablet (1,000 mg total) by mouth 3 (three) times daily. 21 tablet Lurene ShadowPhelps, Marcos Peloso O, PA-C     Controlled Substance Prescriptions [place] Controlled Substance Registry consulted? Not Applicable   Rolla Platehelps, Symir Mah O, PA-C 06/08/19 1832
